# Patient Record
Sex: Male | Born: 1995 | Race: Black or African American | Hispanic: No | Marital: Single | Smoking: Current some day smoker
Health system: Southern US, Community
[De-identification: ages and names within clinical notes are randomized; demographics above are authoritative.]

## PROBLEM LIST (undated history)

## (undated) DIAGNOSIS — F419 Anxiety disorder, unspecified: Secondary | ICD-10-CM

## (undated) DIAGNOSIS — F329 Major depressive disorder, single episode, unspecified: Secondary | ICD-10-CM

## (undated) DIAGNOSIS — F909 Attention-deficit hyperactivity disorder, unspecified type: Secondary | ICD-10-CM

## (undated) DIAGNOSIS — F32A Depression, unspecified: Secondary | ICD-10-CM

## (undated) DIAGNOSIS — R569 Unspecified convulsions: Secondary | ICD-10-CM

---

## 2017-03-17 ENCOUNTER — Encounter (HOSPITAL_COMMUNITY): Payer: Self-pay

## 2017-03-17 ENCOUNTER — Emergency Department (HOSPITAL_COMMUNITY): Payer: Self-pay | Admitting: Anesthesiology

## 2017-03-17 ENCOUNTER — Observation Stay (HOSPITAL_COMMUNITY)
Admission: EM | Admit: 2017-03-17 | Discharge: 2017-03-17 | Disposition: A | Discharge status: Home / Self Care | Payer: Self-pay | Attending: Otolaryngology | Admitting: Otolaryngology

## 2017-03-17 ENCOUNTER — Encounter (HOSPITAL_COMMUNITY)
Admission: EM | Disposition: A | Discharge status: Home / Self Care | Payer: Self-pay | Source: Home / Self Care | Attending: Emergency Medicine

## 2017-03-17 ENCOUNTER — Emergency Department (HOSPITAL_COMMUNITY): Payer: Self-pay

## 2017-03-17 ENCOUNTER — Other Ambulatory Visit: Payer: Self-pay

## 2017-03-17 DIAGNOSIS — S01511A Laceration without foreign body of lip, initial encounter: Secondary | ICD-10-CM | POA: Insufficient documentation

## 2017-03-17 DIAGNOSIS — F17200 Nicotine dependence, unspecified, uncomplicated: Secondary | ICD-10-CM | POA: Insufficient documentation

## 2017-03-17 DIAGNOSIS — S02641A Fracture of ramus of right mandible, initial encounter for closed fracture: Secondary | ICD-10-CM | POA: Insufficient documentation

## 2017-03-17 DIAGNOSIS — S02651A Fracture of angle of right mandible, initial encounter for closed fracture: Principal | ICD-10-CM | POA: Insufficient documentation

## 2017-03-17 DIAGNOSIS — S0181XA Laceration without foreign body of other part of head, initial encounter: Secondary | ICD-10-CM | POA: Insufficient documentation

## 2017-03-17 DIAGNOSIS — X58XXXA Exposure to other specified factors, initial encounter: Secondary | ICD-10-CM | POA: Insufficient documentation

## 2017-03-17 HISTORY — PX: MANDIBLE FRACTURE SURGERY: SHX706

## 2017-03-17 HISTORY — DX: Attention-deficit hyperactivity disorder, unspecified type: F90.9

## 2017-03-17 HISTORY — PX: ORIF MANDIBULAR FRACTURE: SHX2127

## 2017-03-17 HISTORY — DX: Depression, unspecified: F32.A

## 2017-03-17 HISTORY — DX: Major depressive disorder, single episode, unspecified: F32.9

## 2017-03-17 HISTORY — DX: Anxiety disorder, unspecified: F41.9

## 2017-03-17 LAB — CBC
HCT: 43.4 % (ref 39.0–52.0)
Hemoglobin: 14.8 g/dL (ref 13.0–17.0)
MCH: 31.3 pg (ref 26.0–34.0)
MCHC: 34.1 g/dL (ref 30.0–36.0)
MCV: 91.8 fL (ref 78.0–100.0)
PLATELETS: 247 10*3/uL (ref 150–400)
RBC: 4.73 MIL/uL (ref 4.22–5.81)
RDW: 14 % (ref 11.5–15.5)
WBC: 10.8 10*3/uL — AB (ref 4.0–10.5)

## 2017-03-17 LAB — URINALYSIS, ROUTINE W REFLEX MICROSCOPIC
BILIRUBIN URINE: NEGATIVE
Bacteria, UA: NONE SEEN
Glucose, UA: NEGATIVE mg/dL
Hgb urine dipstick: NEGATIVE
Ketones, ur: NEGATIVE mg/dL
Leukocytes, UA: NEGATIVE
Nitrite: NEGATIVE
PH: 8 (ref 5.0–8.0)
Protein, ur: 30 mg/dL — AB
SPECIFIC GRAVITY, URINE: 1.011 (ref 1.005–1.030)
SQUAMOUS EPITHELIAL / LPF: NONE SEEN

## 2017-03-17 LAB — BASIC METABOLIC PANEL
Anion gap: 11 (ref 5–15)
CALCIUM: 9.4 mg/dL (ref 8.9–10.3)
CO2: 26 mmol/L (ref 22–32)
CREATININE: 0.88 mg/dL (ref 0.61–1.24)
Chloride: 104 mmol/L (ref 101–111)
GFR calc non Af Amer: >60 mL/min (ref >60)
Glucose, Bld: 95 mg/dL (ref 65–99)
Potassium: 3.2 mmol/L — ABNORMAL LOW (ref 3.5–5.1)
SODIUM: 141 mmol/L (ref 135–145)

## 2017-03-17 LAB — ETHANOL: ALCOHOL ETHYL (B): 88 mg/dL — AB (ref <10)

## 2017-03-17 SURGERY — OPEN REDUCTION INTERNAL FIXATION (ORIF) MANDIBULAR FRACTURE
Anesthesia: General | Site: Mouth

## 2017-03-17 MED ORDER — LIDOCAINE HCL (PF) 1 % IJ SOLN
5.0000 mL | Freq: Once | INTRAMUSCULAR | Status: AC
  Administered 2017-03-17: 5 mL via INTRADERMAL
  Filled 2017-03-17: qty 5

## 2017-03-17 MED ORDER — OXYMETAZOLINE HCL 0.05 % NA SOLN
NASAL | Status: DC | PRN
  Administered 2017-03-17: 2 via NASAL

## 2017-03-17 MED ORDER — FENTANYL CITRATE (PF) 250 MCG/5ML IJ SOLN
INTRAMUSCULAR | Status: AC
  Filled 2017-03-17: qty 5

## 2017-03-17 MED ORDER — DEXTROSE-NACL 5-0.45 % IV SOLN
INTRAVENOUS | Status: DC
  Administered 2017-03-17: 10:00:00 via INTRAVENOUS

## 2017-03-17 MED ORDER — LIDOCAINE HCL (CARDIAC) 20 MG/ML IV SOLN
INTRAVENOUS | Status: DC | PRN
  Administered 2017-03-17: 100 mg via INTRATRACHEAL

## 2017-03-17 MED ORDER — CEPHALEXIN 500 MG PO CAPS: 500.0000 mg | ORAL_CAPSULE | Freq: Three times a day (TID) | ORAL | 0 refills | Status: DC

## 2017-03-17 MED ORDER — DIPHENHYDRAMINE HCL 50 MG/ML IJ SOLN
INTRAMUSCULAR | Status: DC | PRN
  Administered 2017-03-17: 25 mg via INTRAVENOUS

## 2017-03-17 MED ORDER — OXYCODONE HCL 5 MG/5ML PO SOLN
5.0000 mg | ORAL | Status: DC | PRN
  Administered 2017-03-17 (×2): 5 mg via ORAL
  Filled 2017-03-17 (×2): qty 5

## 2017-03-17 MED ORDER — DEXAMETHASONE SODIUM PHOSPHATE 10 MG/ML IJ SOLN
INTRAMUSCULAR | Status: DC | PRN
  Administered 2017-03-17: 10 mg via INTRAVENOUS

## 2017-03-17 MED ORDER — PROMETHAZINE HCL 25 MG/ML IJ SOLN: 6.2500 mg | INTRAMUSCULAR | Status: DC | PRN

## 2017-03-17 MED ORDER — HYDROCODONE-ACETAMINOPHEN 5-325 MG PO TABS: 1.0000 | ORAL_TABLET | Freq: Four times a day (QID) | ORAL | 0 refills | Status: DC | PRN

## 2017-03-17 MED ORDER — CEFAZOLIN SODIUM-DEXTROSE 2-3 GM-%(50ML) IV SOLR
INTRAVENOUS | Status: DC | PRN
  Administered 2017-03-17: 2 g via INTRAVENOUS

## 2017-03-17 MED ORDER — HYDROCODONE-ACETAMINOPHEN 7.5-325 MG/15ML PO SOLN: 10.0000 mL | ORAL | 0 refills | Status: DC | PRN

## 2017-03-17 MED ORDER — MORPHINE SULFATE (PF) 4 MG/ML IV SOLN: 2.0000 mg | INTRAVENOUS | Status: DC | PRN

## 2017-03-17 MED ORDER — ONDANSETRON HCL 4 MG/2ML IJ SOLN
INTRAMUSCULAR | Status: DC | PRN
  Administered 2017-03-17: 4 mg via INTRAVENOUS

## 2017-03-17 MED ORDER — SUCCINYLCHOLINE CHLORIDE 20 MG/ML IJ SOLN
INTRAMUSCULAR | Status: DC | PRN
  Administered 2017-03-17: 100 mg via INTRAVENOUS

## 2017-03-17 MED ORDER — LIDOCAINE HCL (PF) 1 % IJ SOLN
INTRAMUSCULAR | Status: AC
  Filled 2017-03-17: qty 30

## 2017-03-17 MED ORDER — PROPOFOL 10 MG/ML IV BOLUS
INTRAVENOUS | Status: AC
  Filled 2017-03-17: qty 40

## 2017-03-17 MED ORDER — LACTATED RINGERS IV SOLN
INTRAVENOUS | Status: DC | PRN
  Administered 2017-03-17 (×2): via INTRAVENOUS

## 2017-03-17 MED ORDER — 0.9 % SODIUM CHLORIDE (POUR BTL) OPTIME
TOPICAL | Status: DC | PRN
  Administered 2017-03-17: 1000 mL

## 2017-03-17 MED ORDER — ONDANSETRON HCL 4 MG/2ML IJ SOLN: 4.0000 mg | Freq: Four times a day (QID) | INTRAMUSCULAR | Status: DC | PRN

## 2017-03-17 MED ORDER — CLINDAMYCIN HCL 300 MG PO CAPS: 300.0000 mg | ORAL_CAPSULE | Freq: Three times a day (TID) | ORAL | 0 refills | Status: DC

## 2017-03-17 MED ORDER — CEFAZOLIN SODIUM-DEXTROSE 2-4 GM/100ML-% IV SOLN
2.0000 g | Freq: Three times a day (TID) | INTRAVENOUS | Status: DC
  Administered 2017-03-17: 2 g via INTRAVENOUS
  Filled 2017-03-17 (×3): qty 100

## 2017-03-17 MED ORDER — ONDANSETRON 4 MG PO TBDP: 4.0000 mg | ORAL_TABLET | Freq: Four times a day (QID) | ORAL | Status: DC | PRN

## 2017-03-17 MED ORDER — FENTANYL CITRATE (PF) 250 MCG/5ML IJ SOLN
INTRAMUSCULAR | Status: DC | PRN
  Administered 2017-03-17: 50 ug via INTRAVENOUS
  Administered 2017-03-17: 100 ug via INTRAVENOUS

## 2017-03-17 MED ORDER — FENTANYL CITRATE (PF) 100 MCG/2ML IJ SOLN: 25.0000 ug | INTRAMUSCULAR | Status: DC | PRN

## 2017-03-17 MED ORDER — PROPOFOL 10 MG/ML IV BOLUS
INTRAVENOUS | Status: DC | PRN
  Administered 2017-03-17: 200 mg via INTRAVENOUS

## 2017-03-17 MED ORDER — OXYMETAZOLINE HCL 0.05 % NA SOLN
NASAL | Status: AC
  Filled 2017-03-17: qty 15

## 2017-03-17 SURGICAL SUPPLY — 38 items
BLADE SURG 10 STRL SS (BLADE) ×3 IMPLANT
BLADE SURG 15 STRL LF DISP TIS (BLADE) IMPLANT
BLADE SURG 15 STRL SS (BLADE)
CANISTER SUCT 3000ML PPV (MISCELLANEOUS) ×3 IMPLANT
CLEANER TIP ELECTROSURG 2X2 (MISCELLANEOUS) ×3 IMPLANT
CONFORMERS SILICONE 5649 (OPHTHALMIC RELATED) IMPLANT
COVER SURGICAL LIGHT HANDLE (MISCELLANEOUS) ×6 IMPLANT
CRADLE DONUT ADULT HEAD (MISCELLANEOUS) IMPLANT
DECANTER SPIKE VIAL GLASS SM (MISCELLANEOUS) ×3 IMPLANT
DRAPE HALF SHEET 40X57 (DRAPES) IMPLANT
ELECT COATED BLADE 2.86 ST (ELECTRODE) ×3 IMPLANT
ELECT NEEDLE BLADE 2-5/6 (NEEDLE) IMPLANT
ELECT REM PT RETURN 9FT ADLT (ELECTROSURGICAL) ×3
ELECTRODE REM PT RTRN 9FT ADLT (ELECTROSURGICAL) ×1 IMPLANT
GLOVE ECLIPSE 7.5 STRL STRAW (GLOVE) ×3 IMPLANT
GOWN STRL REUS W/ TWL LRG LVL3 (GOWN DISPOSABLE) ×2 IMPLANT
GOWN STRL REUS W/TWL LRG LVL3 (GOWN DISPOSABLE) ×4
KIT BASIN OR (CUSTOM PROCEDURE TRAY) ×3 IMPLANT
KIT ROOM TURNOVER OR (KITS) ×3 IMPLANT
NEEDLE HYPO 25GX1X1/2 BEV (NEEDLE) IMPLANT
NS IRRIG 1000ML POUR BTL (IV SOLUTION) ×3 IMPLANT
PAD ARMBOARD 7.5X6 YLW CONV (MISCELLANEOUS) ×6 IMPLANT
PATTIES SURGICAL .5 X3 (DISPOSABLE) IMPLANT
PENCIL FOOT CONTROL (ELECTRODE) ×3 IMPLANT
SCREW UPPER FACE 2.0X12MM (Screw) ×12 IMPLANT
SUCTION FRAZIER HANDLE 10FR (MISCELLANEOUS) ×2
SUCTION TUBE FRAZIER 10FR DISP (MISCELLANEOUS) ×1 IMPLANT
SUT CHROMIC 3 0 PS 2 (SUTURE) ×3 IMPLANT
SUT CHROMIC 4 0 PS 2 18 (SUTURE) ×3 IMPLANT
SUT ETHILON 5 0 P 3 18 (SUTURE) ×2
SUT NYLON ETHILON 5-0 P-3 1X18 (SUTURE) ×1 IMPLANT
SUT SILK 2 0 PERMA HAND 18 BK (SUTURE) IMPLANT
SUT STEEL 0 (SUTURE)
SUT STEEL 0 18XMFL TIE 17 (SUTURE) IMPLANT
SUT STEEL 4 (SUTURE) ×3 IMPLANT
TOWEL OR 17X24 6PK STRL BLUE (TOWEL DISPOSABLE) ×3 IMPLANT
TRAY ENT MC OR (CUSTOM PROCEDURE TRAY) ×3 IMPLANT
WATER STERILE IRR 1000ML POUR (IV SOLUTION) ×3 IMPLANT
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
## 2017-03-17 NOTE — Transfer of Care (Signed)
Immediate Anesthesia Transfer of Care Note  Patient: Brett Underwood  Procedure(s) Performed: MAXILLA MANDIBULAR FRACTURE FIXATION (N/A Mouth)  Patient Location: PACU  Anesthesia Type:General  Level of Consciousness: awake, alert  and oriented  Airway & Oxygen Therapy: Patient Spontanous Breathing and Patient connected to nasal cannula oxygen  Post-op Assessment: Report given to RN, Post -op Vital signs reviewed and stable and Patient moving all extremities X 4  Post vital signs: Reviewed and stable  Last Vitals:  Vitals:   03/17/17 0500 03/17/17 0515  BP: 129/79 117/67  Pulse: 96 95  Resp: 14   Temp:    SpO2: 96% 95%    Last Pain:  Vitals:   03/17/17 0340  TempSrc:   PainSc: 10-Worst pain ever         Complications: No apparent anesthesia complications
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
## 2017-03-17 NOTE — Discharge Summary (Signed)
Physician Discharge Summary  Patient ID: Brett Underwood MRN: [id number] DOB/AGE: 1995/07/26 22 y.o.  Admit date: 03/17/2017 Discharge date: 03/17/2017  Admission Diagnoses:  Active Problems:   Fracture of angle of right mandible, initial encounter for closed fracture [facility])   Discharge Diagnoses:  Same  Surgeries: Procedure(s): MAXILLA MANDIBULAR FRACTURE FIXATION on 03/17/2017   Consultants: None  Discharged Condition: Improved  Hospital Course: Brett Underwood is an 22 y.o. male who was admitted 03/17/2017 with a diagnosis of Active Problems:   Fracture of angle of right mandible, initial encounter for closed fracture (HCC)  and went to the operating room on 03/17/2017 and underwent the above named procedures.   Pt stable d/c pm 3/1. F/u in ~2 wks.  Recent vital signs:  Vitals:   03/17/17 0754 03/17/17 1419  BP: (!) 145/100 (!) 163/90  Pulse: 82 94  Resp: 19   Temp: 99.3 F (37.4 C) 98 F (36.7 C)  SpO2: 97% 100%    Recent laboratory studies:  Results for orders placed or performed during the hospital encounter of 03/17/17  Basic metabolic panel  Result Value Ref Range   Sodium 141 135 - 145 mmol/L   Potassium 3.2 (L) 3.5 - 5.1 mmol/L   Chloride 104 101 - 111 mmol/L   CO2 26 22 - 32 mmol/L   Glucose, Bld 95 65 - 99 mg/dL   BUN <5 (L) 6 - 20 mg/dL   Creatinine, Ser 9.62 0.61 - 1.24 mg/dL   Calcium 9.4 8.9 - 95.2 mg/dL   GFR calc non Af Amer >60 >60 mL/min   GFR calc Af Amer >60 >60 mL/min   Anion gap 11 5 - 15  CBC  Result Value Ref Range   WBC 10.8 (H) 4.0 - 10.5 K/uL   RBC 4.73 4.22 - 5.81 MIL/uL   Hemoglobin 14.8 13.0 - 17.0 g/dL   HCT 84.1 32.4 - 40.1 %   MCV 91.8 78.0 - 100.0 fL   MCH 31.3 26.0 - 34.0 pg   MCHC 34.1 30.0 - 36.0 g/dL   RDW 02.7 25.3 - 66.4 %   Platelets 247 150 - 400 K/uL  Urinalysis, Routine w reflex microscopic  Result Value Ref Range   Color, Urine YELLOW YELLOW   APPearance CLEAR CLEAR   Specific Gravity, Urine 1.011 1.005 - 1.030   pH  8.0 5.0 - 8.0   Glucose, UA NEGATIVE NEGATIVE mg/dL   Hgb urine dipstick NEGATIVE NEGATIVE   Bilirubin Urine NEGATIVE NEGATIVE   Ketones, ur NEGATIVE NEGATIVE mg/dL   Protein, ur 30 (A) NEGATIVE mg/dL   Nitrite NEGATIVE NEGATIVE   Leukocytes, UA NEGATIVE NEGATIVE   RBC / HPF 0-5 0 - 5 RBC/hpf   WBC, UA 6-30 0 - 5 WBC/hpf   Bacteria, UA NONE SEEN NONE SEEN   Squamous Epithelial / LPF NONE SEEN NONE SEEN   Mucus PRESENT    Hyaline Casts, UA PRESENT   Ethanol  Result Value Ref Range   Alcohol, Ethyl (B) 88 (H) <10 mg/dL    Discharge Medications:   Allergies as of 03/17/2017   No Known Allergies     Medication List    TAKE these medications   cephALEXin 500 MG capsule Commonly known as:  KEFLEX Take 1 capsule (500 mg total) by mouth 3 (three) times daily.   clindamycin 300 MG capsule Commonly known as:  CLEOCIN Take 1 capsule (300 mg total) by mouth 3 (three) times daily. May open cap and take with liquid  HYDROcodone-acetaminophen 5-325 MG tablet Commonly known as:  LORTAB Take 1 tablet by mouth every 6 (six) hours as needed for moderate pain.   HYDROcodone-acetaminophen 7.5-325 mg/15 ml solution Commonly known as:  HYCET Take 10-15 mLs by mouth every 4 (four) hours as needed for moderate pain.       Diagnostic Studies: Ct Head Wo Contrast  Result Date: 03/17/2017 CLINICAL DATA:  22 year old male with trauma. EXAM: CT HEAD WITHOUT CONTRAST CT MAXILLOFACIAL WITHOUT CONTRAST CT CERVICAL SPINE WITHOUT CONTRAST TECHNIQUE: Multidetector CT imaging of the head, cervical spine, and maxillofacial structures were performed using the standard protocol without intravenous contrast. Multiplanar CT image reconstructions of the cervical spine and maxillofacial structures were also generated. COMPARISON:  None. FINDINGS: CT HEAD FINDINGS Brain: No evidence of acute infarction, hemorrhage, hydrocephalus, extra-axial collection or mass lesion/mass effect. Vascular: No hyperdense vessel or  unexpected calcification. Skull: Normal. Negative for fracture or focal lesion. Other: None CT MAXILLOFACIAL FINDINGS Osseous: There is a nondisplaced fracture of the right mandibular ramus. No other acute fracture identified. There is thin lucency surrounding the root of the left maxillary central incisor tooth which may be related to prior dental work or represent loosening. Clinical correlation is recommended. Orbits: Negative. No traumatic or inflammatory finding. Sinuses: Clear. Soft tissues: There is linear high attenuating material in the inferior lip likely related to laceration and small foreign object. Clinical correlation is recommended. CT CERVICAL SPINE FINDINGS Alignment: Normal. Skull base and vertebrae: No acute fracture. No primary bone lesion or focal pathologic process. Soft tissues and spinal canal: No prevertebral fluid or swelling. No visible canal hematoma. Disc levels:  No acute findings.  No degenerative changes Upper chest: Negative. Other: None IMPRESSION: 1. Normal noncontrast CT of the brain. 2. No acute/traumatic cervical spine pathology. 3. Nondisplaced fracture of the right mandibular ramus. 4. Laceration of the lower lip with probable retained foreign objects. Clinical correlation is recommended. Electronically Signed   By: Elgie Collard M.D.   On: 03/17/2017 02:30   Ct Cervical Spine Wo Contrast  Result Date: 03/17/2017 CLINICAL DATA:  22 year old male with trauma. EXAM: CT HEAD WITHOUT CONTRAST CT MAXILLOFACIAL WITHOUT CONTRAST CT CERVICAL SPINE WITHOUT CONTRAST TECHNIQUE: Multidetector CT imaging of the head, cervical spine, and maxillofacial structures were performed using the standard protocol without intravenous contrast. Multiplanar CT image reconstructions of the cervical spine and maxillofacial structures were also generated. COMPARISON:  None. FINDINGS: CT HEAD FINDINGS Brain: No evidence of acute infarction, hemorrhage, hydrocephalus, extra-axial collection or mass  lesion/mass effect. Vascular: No hyperdense vessel or unexpected calcification. Skull: Normal. Negative for fracture or focal lesion. Other: None CT MAXILLOFACIAL FINDINGS Osseous: There is a nondisplaced fracture of the right mandibular ramus. No other acute fracture identified. There is thin lucency surrounding the root of the left maxillary central incisor tooth which may be related to prior dental work or represent loosening. Clinical correlation is recommended. Orbits: Negative. No traumatic or inflammatory finding. Sinuses: Clear. Soft tissues: There is linear high attenuating material in the inferior lip likely related to laceration and small foreign object. Clinical correlation is recommended. CT CERVICAL SPINE FINDINGS Alignment: Normal. Skull base and vertebrae: No acute fracture. No primary bone lesion or focal pathologic process. Soft tissues and spinal canal: No prevertebral fluid or swelling. No visible canal hematoma. Disc levels:  No acute findings.  No degenerative changes Upper chest: Negative. Other: None IMPRESSION: 1. Normal noncontrast CT of the brain. 2. No acute/traumatic cervical spine pathology. 3. Nondisplaced fracture of the right mandibular  ramus. 4. Laceration of the lower lip with probable retained foreign objects. Clinical correlation is recommended. Electronically Signed   By: Elgie Collard M.D.   On: 03/17/2017 02:30   Ct Maxillofacial Wo Contrast  Result Date: 03/17/2017 CLINICAL DATA:  22 year old male with trauma. EXAM: CT HEAD WITHOUT CONTRAST CT MAXILLOFACIAL WITHOUT CONTRAST CT CERVICAL SPINE WITHOUT CONTRAST TECHNIQUE: Multidetector CT imaging of the head, cervical spine, and maxillofacial structures were performed using the standard protocol without intravenous contrast. Multiplanar CT image reconstructions of the cervical spine and maxillofacial structures were also generated. COMPARISON:  None. FINDINGS: CT HEAD FINDINGS Brain: No evidence of acute infarction,  hemorrhage, hydrocephalus, extra-axial collection or mass lesion/mass effect. Vascular: No hyperdense vessel or unexpected calcification. Skull: Normal. Negative for fracture or focal lesion. Other: None CT MAXILLOFACIAL FINDINGS Osseous: There is a nondisplaced fracture of the right mandibular ramus. No other acute fracture identified. There is thin lucency surrounding the root of the left maxillary central incisor tooth which may be related to prior dental work or represent loosening. Clinical correlation is recommended. Orbits: Negative. No traumatic or inflammatory finding. Sinuses: Clear. Soft tissues: There is linear high attenuating material in the inferior lip likely related to laceration and small foreign object. Clinical correlation is recommended. CT CERVICAL SPINE FINDINGS Alignment: Normal. Skull base and vertebrae: No acute fracture. No primary bone lesion or focal pathologic process. Soft tissues and spinal canal: No prevertebral fluid or swelling. No visible canal hematoma. Disc levels:  No acute findings.  No degenerative changes Upper chest: Negative. Other: None IMPRESSION: 1. Normal noncontrast CT of the brain. 2. No acute/traumatic cervical spine pathology. 3. Nondisplaced fracture of the right mandibular ramus. 4. Laceration of the lower lip with probable retained foreign objects. Clinical correlation is recommended. Electronically Signed   By: Elgie Collard M.D.   On: 03/17/2017 02:30    Disposition: Final discharge disposition Brett confirmed  Discharge Instructions    Call MD for:  difficulty breathing, headache or visual disturbances   Complete by:  As directed    Call MD for:  extreme fatigue   Complete by:  As directed    Call MD for:  hives   Complete by:  As directed    Call MD for:  persistant dizziness or light-headedness   Complete by:  As directed    Call MD for:  persistant nausea and vomiting   Complete by:  As directed    Call MD for:  redness, tenderness, or  signs of infection (pain, swelling, redness, odor or green/yellow discharge around incision site)   Complete by:  As directed    Call MD for:  severe uncontrolled pain   Complete by:  As directed    Call MD for:  temperature >100.4   Complete by:  As directed    Diet - low sodium heart healthy   Complete by:  As directed    Diet - low sodium heart healthy   Complete by:  As directed    Discharge instructions   Complete by:  As directed    You MUST have wire cutters with you at all times. Follow up in 1 week to office and call for appointment at [phone]. Call if the wires are loose. You need to see a dentist immediately about the front tooth which is very loose. Call if any questions   Discharge instructions   Complete by:  As directed    Jaw Fracture Instructions:  1. Limited activity 2. Liquid diet only  3. May bathe and shower 4. Saline nasal spray - 4 puffs/nostril 4 times daily 5. Elevate Head of Bed 6. Ice compress to jaw 7. Rinse mouth with water twice daily 8. Motrin Elixir three times daily for 10 days 9. Keep wire cutters on hand at ALL TIMES, only cut wires if vomiting  Contact our office immediately if wires are cut   Fractured Jaw Diet This diet should be used after jaw or mouth surgery, wired jaw surgery, or dental surgery. The consistency of foods in this diet should be thin enough to be sipped from a straw or given through a syringe. It is important to consume enough calories and protein to prevent weight loss and to promote healing after surgery. You will need to have 3 meals and 3 snacks daily. It is important to eat from a variety of food groups. Foods you normally eat may be blended to the correct texture. INSTRUCTIONS FOR BLENDING FOODS Prepare foods by removing skins, seeds, and peels.  Cook meats and vegetables thoroughly. Avoid using raw eggs. Powdered or pasteurized egg mixtures may be used.  Cut foods into small pieces and mix with a small amount of  liquid in a food processor or blender. Continue to add liquids until foods become thin enough to sip through a straw.  Add juice, milk, cream, broth, gravy, or vegetable juice to add flavor and to thin foods.  Blending foods sometimes causes air bubbles that may Brett be desirable. Heating foods after blending will reduce the foam produced from blending.  TIPS If you are losing weight, you may need to add extra calories to your food by:  Adding powdered milk or protein powder to food.  Adding extra fats, such as tub margarine, sour cream, cream cheese, cream, nut butters (such as peanut butter or almond butter), and half-and-half.  Adding sweets, such as honey, ice cream, black strap molasses, or sugar.  Your teeth and mouth may be sensitive to extreme temperatures. Heat or cool your foods only to lukewarm or cool temperatures.  Stock Water quality scientist with a variety of liquid nutritional supplement drinks.  Take a liquid multivitamin to ensure you are getting adequate nutrients.  Use baby food if you are short on time or energy.  FOOD CHOICES ALL FOODS MUST BE BLENDED. Starches 4 servings or more Hot cereals, such as oatmeal, grits, ground wheat cereals, and polenta.  Mashed potatoes.  Vegetables 3 servings or more All vegetables and vegetable juices.  Fruit 2 servings or more All fruits and fruit juices.  Meat and Meat Substitutes 2 servings or more (6 oz per day) Soft-boiled eggs, scrambled eggs, cottage cheese, cheese sauce.  Ground meats, such as hamburger, [place], sausage, meatloaf.  Custard, baby food meat.  Milk 2 cups or equivalent Liquid, powdered, or evaporated milk.  Buttermilk or chocolate milk.  Drinkable yogurt.  Sherbert, ice cream, milkshakes, eggnog, pudding, and liquid supplements.  Beverages Coffee (regular or decaffeinated), tea, and mineral water.  Liquid supplements.  Condiments All seasonings and condiments that blend well.   Document Released: 06/23/2009  Document Revised: 09/15/2010 Document Reviewed: 06/23/2009 [facility] Patient Information 2012 [place], [place].   Increase activity slowly   Complete by:  As directed    Increase activity slowly   Complete by:  As directed       Follow-up Information    Suzanna Obey, MD. Schedule an appointment as soon as possible for a visit in 2 week(s).   Specialty:  Otolaryngology Contact information: [phone] [address] [place] [address] [phone]            Signed: Osborn CohoSHOEMAKER, Sharolyn Weber 03/17/2017, 5:43 PM
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
## 2017-03-17 NOTE — ED Notes (Signed)
Attempted to contact patient's family members. No response at this time.

## 2017-03-17 NOTE — Progress Notes (Signed)
ENT Progress Note: POD #1 s/p Procedure(s): MAXILLA MANDIBULAR FRACTURE FIXATION   Subjective: Awake and alert, mild discomfort  Objective: Vital signs in last 24 hours: Temp:  [98 F (36.7 C)-99.3 F (37.4 C)] 98 F (36.7 C) (03/01 1419) Pulse Rate:  [82-103] 94 (03/01 1419) Resp:  [10-26] 19 (03/01 0754) BP: (114-163)/(61-107) 163/90 (03/01 1419) SpO2:  [95 %-100 %] 100 % (03/01 1419) Weight:  [74 kg (163 lb 2.3 oz)] 74 kg (163 lb 2.3 oz) (03/01 1419) Weight change:     Intake/Output from previous day: 02/28 0701 - 03/01 0700 In: 1100 [I.V.:1100] Out: 10 [Blood:10] Intake/Output this shift: Total I/O In: 2510 [P.O.:1560; I.V.:850; IV Piggyback:100] Out: 3000 [Urine:3000]  Labs: Recent Labs    03/17/17 0219  WBC 10.8*  HGB 14.8  HCT 43.4  PLT 247   Recent Labs    03/17/17 0219  NA 141  K 3.2*  CL 104  CO2 26  GLUCOSE 95  BUN <5*  CALCIUM 9.4    Studies/Results: Ct Head Wo Contrast  Result Date: 03/17/2017 CLINICAL DATA:  22 year old male with trauma. EXAM: CT HEAD WITHOUT CONTRAST CT MAXILLOFACIAL WITHOUT CONTRAST CT CERVICAL SPINE WITHOUT CONTRAST TECHNIQUE: Multidetector CT imaging of the head, cervical spine, and maxillofacial structures were performed using the standard protocol without intravenous contrast. Multiplanar CT image reconstructions of the cervical spine and maxillofacial structures were also generated. COMPARISON:  None. FINDINGS: CT HEAD FINDINGS Brain: No evidence of acute infarction, hemorrhage, hydrocephalus, extra-axial collection or mass lesion/mass effect. Vascular: No hyperdense vessel or unexpected calcification. Skull: Normal. Negative for fracture or focal lesion. Other: None CT MAXILLOFACIAL FINDINGS Osseous: There is a nondisplaced fracture of the right mandibular ramus. No other acute fracture identified. There is thin lucency surrounding the root of the left maxillary central incisor tooth which may be related to prior dental  work or represent loosening. Clinical correlation is recommended. Orbits: Negative. No traumatic or inflammatory finding. Sinuses: Clear. Soft tissues: There is linear high attenuating material in the inferior lip likely related to laceration and small foreign object. Clinical correlation is recommended. CT CERVICAL SPINE FINDINGS Alignment: Normal. Skull base and vertebrae: No acute fracture. No primary bone lesion or focal pathologic process. Soft tissues and spinal canal: No prevertebral fluid or swelling. No visible canal hematoma. Disc levels:  No acute findings.  No degenerative changes Upper chest: Negative. Other: None IMPRESSION: 1. Normal noncontrast CT of the brain. 2. No acute/traumatic cervical spine pathology. 3. Nondisplaced fracture of the right mandibular ramus. 4. Laceration of the lower lip with probable retained foreign objects. Clinical correlation is recommended. Electronically Signed   By: Elgie CollardArash  Radparvar M.D.   On: 03/17/2017 02:30   Ct Cervical Spine Wo Contrast  Result Date: 03/17/2017 CLINICAL DATA:  22 year old male with trauma. EXAM: CT HEAD WITHOUT CONTRAST CT MAXILLOFACIAL WITHOUT CONTRAST CT CERVICAL SPINE WITHOUT CONTRAST TECHNIQUE: Multidetector CT imaging of the head, cervical spine, and maxillofacial structures were performed using the standard protocol without intravenous contrast. Multiplanar CT image reconstructions of the cervical spine and maxillofacial structures were also generated. COMPARISON:  None. FINDINGS: CT HEAD FINDINGS Brain: No evidence of acute infarction, hemorrhage, hydrocephalus, extra-axial collection or mass lesion/mass effect. Vascular: No hyperdense vessel or unexpected calcification. Skull: Normal. Negative for fracture or focal lesion. Other: None CT MAXILLOFACIAL FINDINGS Osseous: There is a nondisplaced fracture of the right mandibular ramus. No other acute fracture identified. There is thin lucency surrounding the root of the left maxillary  central incisor tooth  which may be related to prior dental work or represent loosening. Clinical correlation is recommended. Orbits: Negative. No traumatic or inflammatory finding. Sinuses: Clear. Soft tissues: There is linear high attenuating material in the inferior lip likely related to laceration and small foreign object. Clinical correlation is recommended. CT CERVICAL SPINE FINDINGS Alignment: Normal. Skull base and vertebrae: No acute fracture. No primary bone lesion or focal pathologic process. Soft tissues and spinal canal: No prevertebral fluid or swelling. No visible canal hematoma. Disc levels:  No acute findings.  No degenerative changes Upper chest: Negative. Other: None IMPRESSION: 1. Normal noncontrast CT of the brain. 2. No acute/traumatic cervical spine pathology. 3. Nondisplaced fracture of the right mandibular ramus. 4. Laceration of the lower lip with probable retained foreign objects. Clinical correlation is recommended. Electronically Signed   By: Elgie Collard M.D.   On: 03/17/2017 02:30   Ct Maxillofacial Wo Contrast  Result Date: 03/17/2017 CLINICAL DATA:  22 year old male with trauma. EXAM: CT HEAD WITHOUT CONTRAST CT MAXILLOFACIAL WITHOUT CONTRAST CT CERVICAL SPINE WITHOUT CONTRAST TECHNIQUE: Multidetector CT imaging of the head, cervical spine, and maxillofacial structures were performed using the standard protocol without intravenous contrast. Multiplanar CT image reconstructions of the cervical spine and maxillofacial structures were also generated. COMPARISON:  None. FINDINGS: CT HEAD FINDINGS Brain: No evidence of acute infarction, hemorrhage, hydrocephalus, extra-axial collection or mass lesion/mass effect. Vascular: No hyperdense vessel or unexpected calcification. Skull: Normal. Negative for fracture or focal lesion. Other: None CT MAXILLOFACIAL FINDINGS Osseous: There is a nondisplaced fracture of the right mandibular ramus. No other acute fracture identified. There is thin  lucency surrounding the root of the left maxillary central incisor tooth which may be related to prior dental work or represent loosening. Clinical correlation is recommended. Orbits: Negative. No traumatic or inflammatory finding. Sinuses: Clear. Soft tissues: There is linear high attenuating material in the inferior lip likely related to laceration and small foreign object. Clinical correlation is recommended. CT CERVICAL SPINE FINDINGS Alignment: Normal. Skull base and vertebrae: No acute fracture. No primary bone lesion or focal pathologic process. Soft tissues and spinal canal: No prevertebral fluid or swelling. No visible canal hematoma. Disc levels:  No acute findings.  No degenerative changes Upper chest: Negative. Other: None IMPRESSION: 1. Normal noncontrast CT of the brain. 2. No acute/traumatic cervical spine pathology. 3. Nondisplaced fracture of the right mandibular ramus. 4. Laceration of the lower lip with probable retained foreign objects. Clinical correlation is recommended. Electronically Signed   By: Elgie Collard M.D.   On: 03/17/2017 02:30     PHYSICAL EXAM: MMF in place Min swelling   Assessment/Plan: D/c to home Fracture precautions Plan f/u with Dr. Eual Fines, Nahum Sherrer 03/17/2017, 5:37 PM

## 2017-03-17 NOTE — Progress Notes (Signed)
Patient just left room accompanied by CNA via wheelchair to meet his friend at [facility] entrance.  Discharge insturctions previously given by day shift RN, gave the wire cutter, discharged instructions and prescription.

## 2017-03-17 NOTE — ED Notes (Signed)
Patient to CT on tele

## 2017-03-17 NOTE — ED Notes (Addendum)
MD Byers at bedside  

## 2017-03-17 NOTE — Progress Notes (Signed)
Patient is still waiting for his friend to bring him home, [facility]  Patient just requested PRN pain medication.

## 2017-03-17 NOTE — Care Management Note (Signed)
Case Management Note  Patient Details  Name: Brett Underwood MRN: [id number] Date of Birth: 1996-01-12  Subjective/Objective:                    Action/Plan:  Spoke to patient at bedside . He has prescription coverage and is able to get prescriptions filled.  Expected Discharge Date:  03/17/17               Expected Discharge Plan:  Home/Self Care  In-House Referral:     Discharge planning Services     Post Acute Care Choice:    Choice offered to:  Patient  DME Arranged:  N/A DME Agency:  NA  HH Arranged:  NA HH Agency:  NA  Status of Service:  Completed, signed off  If discussed at Long Length of Stay Meetings, dates discussed:    Additional Comments:  Kingsley PlanWile, Aryssa Rosamond Marie, RN 03/17/2017, 10:49 AM

## 2017-03-17 NOTE — Anesthesia Preprocedure Evaluation (Addendum)
Anesthesia Evaluation  Patient identified by MRN, date of birth, ID band Patient awake    Reviewed: Allergy & Precautions, NPO status , Patient's Chart, lab work & pertinent test results  Airway Mallampati: II  TM Distance: >3 FB Neck ROM: Full  Mouth opening: Limited Mouth Opening  Dental  (+) Teeth Intact, Dental Advisory Given, Loose, Chipped,    Pulmonary Current Smoker,    Pulmonary exam normal breath sounds clear to auscultation       Cardiovascular Exercise Tolerance: Good negative cardio ROS Normal cardiovascular exam Rhythm:Regular Rate:Normal     Neuro/Psych negative neurological ROS  negative psych ROS   GI/Hepatic negative GI ROS, (+)     substance abuse  alcohol use and marijuana use,   Endo/Other  negative endocrine ROS  Renal/GU negative Renal ROS     Musculoskeletal negative musculoskeletal ROS (+)   Abdominal   Peds  Hematology negative hematology ROS (+)   Anesthesia Other Findings Day of surgery medications reviewed with the patient.  Reproductive/Obstetrics                             Anesthesia Physical Anesthesia Plan  ASA: II and emergent  Anesthesia Plan: General   Post-op Pain Management:    Induction: Intravenous  PONV Risk Score and Plan: 1 and Midazolam, Dexamethasone and Ondansetron  Airway Management Planned: Nasal ETT  Additional Equipment:   Intra-op Plan:   Post-operative Plan: Extubation in OR  Informed Consent: I have reviewed the patients History and Physical, chart, labs and discussed the procedure including the risks, benefits and alternatives for the proposed anesthesia with the patient or authorized representative who has indicated his/her understanding and acceptance.   Dental advisory given  Plan Discussed with: CRNA, Anesthesiologist and Surgeon  Anesthesia Plan Comments:        Anesthesia Quick Evaluation

## 2017-03-17 NOTE — Anesthesia Procedure Notes (Signed)
Procedure Name: Intubation Date/Time: 03/17/2017 6:26 AM Performed by: Claudina LickMahony, Fidelis Loth D, CRNA Pre-anesthesia Checklist: Patient identified, Emergency Drugs available, Suction available, Patient being monitored and Timeout performed Patient Re-evaluated:Patient Re-evaluated prior to induction Oxygen Delivery Method: Circle system utilized Preoxygenation: Pre-oxygenation with 100% oxygen Induction Type: Rapid sequence and Cricoid Pressure applied Ventilation: Nasal airway inserted- appropriate to patient size Laryngoscope Size: Glidescope Grade View: Grade I Nasal Tubes: Right, Nasal prep performed and Nasal Rae Tube size: 7.5 mm Number of attempts: 1 Airway Equipment and Method: Video-laryngoscopy (Elective Glidescope) Placement Confirmation: ETT inserted through vocal cords under direct vision,  positive ETCO2 and breath sounds checked- equal and bilateral Secured at: 29 cm Tube secured with: Tape Dental Injury: Teeth and Oropharynx as per pre-operative assessment

## 2017-03-17 NOTE — ED Notes (Signed)
ED Provider at bedside. 

## 2017-03-18 LAB — HIV ANTIBODY (ROUTINE TESTING W REFLEX): HIV SCREEN 4TH GENERATION: NONREACTIVE

## 2017-03-18 NOTE — Anesthesia Postprocedure Evaluation (Signed)
Anesthesia Post Note  Patient: Brett Underwood  Procedure(s) Performed: MAXILLA MANDIBULAR FRACTURE FIXATION (N/A Mouth)     Patient location during evaluation: PACU Anesthesia Type: General Level of consciousness: awake and alert Pain management: pain level controlled Vital Signs Assessment: post-procedure vital signs reviewed and stable Respiratory status: spontaneous breathing, nonlabored ventilation and respiratory function stable Cardiovascular status: blood pressure returned to baseline and stable Postop Assessment: no apparent nausea or vomiting Anesthetic complications: no    Last Vitals:  Vitals:   03/17/17 1419 03/17/17 1801  BP: (!) 163/90 (!) 159/99  Pulse: 94 95  Resp:    Temp: 36.7 C 36.7 C  SpO2: 100% 100%    Last Pain:  Vitals:   03/17/17 2215  TempSrc:   PainSc: 2                  Cecile HearingStephen Edward Oaklan Persons

## 2017-03-20 ENCOUNTER — Encounter (HOSPITAL_COMMUNITY): Payer: Self-pay | Admitting: Otolaryngology

## 2017-08-28 ENCOUNTER — Encounter (HOSPITAL_COMMUNITY): Payer: Self-pay | Admitting: *Deleted

## 2017-08-28 ENCOUNTER — Emergency Department (HOSPITAL_COMMUNITY)
Admission: EM | Admit: 2017-08-28 | Discharge: 2017-08-28 | Disposition: A | Discharge status: Home / Self Care | Payer: Self-pay | Attending: Emergency Medicine | Admitting: Emergency Medicine

## 2017-08-28 ENCOUNTER — Other Ambulatory Visit: Payer: Self-pay

## 2017-08-28 ENCOUNTER — Emergency Department (HOSPITAL_COMMUNITY)
Admission: EM | Admit: 2017-08-28 | Discharge: 2017-08-29 | Disposition: A | Discharge status: Home / Self Care | Payer: No Typology Code available for payment source | Attending: Emergency Medicine | Admitting: Emergency Medicine

## 2017-08-28 DIAGNOSIS — R519 Headache, unspecified: Secondary | ICD-10-CM

## 2017-08-28 DIAGNOSIS — R51 Headache: Secondary | ICD-10-CM

## 2017-08-28 DIAGNOSIS — F17210 Nicotine dependence, cigarettes, uncomplicated: Secondary | ICD-10-CM | POA: Insufficient documentation

## 2017-08-28 DIAGNOSIS — R45851 Suicidal ideations: Secondary | ICD-10-CM

## 2017-08-28 DIAGNOSIS — R6884 Jaw pain: Secondary | ICD-10-CM | POA: Insufficient documentation

## 2017-08-28 DIAGNOSIS — F333 Major depressive disorder, recurrent, severe with psychotic symptoms: Secondary | ICD-10-CM | POA: Insufficient documentation

## 2017-08-28 DIAGNOSIS — Z79899 Other long term (current) drug therapy: Secondary | ICD-10-CM | POA: Insufficient documentation

## 2017-08-28 HISTORY — DX: Unspecified convulsions: R56.9

## 2017-08-28 LAB — COMPREHENSIVE METABOLIC PANEL
ALK PHOS: 54 U/L (ref 38–126)
ALT: 37 U/L (ref 0–44)
AST: 27 U/L (ref 15–41)
Albumin: 4.6 g/dL (ref 3.5–5.0)
Anion gap: 10 (ref 5–15)
BILIRUBIN TOTAL: 0.8 mg/dL (ref 0.3–1.2)
BUN: 11 mg/dL (ref 6–20)
CO2: 27 mmol/L (ref 22–32)
Calcium: 9.5 mg/dL (ref 8.9–10.3)
Chloride: 104 mmol/L (ref 98–111)
Creatinine, Ser: 0.89 mg/dL (ref 0.61–1.24)
GFR calc Af Amer: >60 mL/min (ref >60)
Glucose, Bld: 94 mg/dL (ref 70–99)
Potassium: 3.7 mmol/L (ref 3.5–5.1)
Sodium: 141 mmol/L (ref 135–145)
TOTAL PROTEIN: 7.8 g/dL (ref 6.5–8.1)

## 2017-08-28 LAB — CBC
HCT: 42.8 % (ref 39.0–52.0)
Hemoglobin: 14.6 g/dL (ref 13.0–17.0)
MCH: 30.7 pg (ref 26.0–34.0)
MCHC: 34.1 g/dL (ref 30.0–36.0)
MCV: 90.1 fL (ref 78.0–100.0)
PLATELETS: 333 10*3/uL (ref 150–400)
RBC: 4.75 MIL/uL (ref 4.22–5.81)
RDW: 14.2 % (ref 11.5–15.5)
WBC: 11 10*3/uL — ABNORMAL HIGH (ref 4.0–10.5)

## 2017-08-28 LAB — ACETAMINOPHEN LEVEL: Acetaminophen (Tylenol), Serum: <10 ug/mL — ABNORMAL LOW (ref 10–30)

## 2017-08-28 LAB — SALICYLATE LEVEL: Salicylate Lvl: <7 mg/dL (ref 2.8–30.0)

## 2017-08-28 LAB — ETHANOL

## 2017-08-28 MED ORDER — ACETAMINOPHEN 325 MG PO TABS
325.00 | ORAL_TABLET | ORAL | Status: DC
Start: ? — End: 2017-08-28

## 2017-08-28 MED ORDER — NICOTINE POLACRILEX 2 MG MT GUM
2.00 | CHEWING_GUM | OROMUCOSAL | Status: DC
Start: ? — End: 2017-08-28

## 2017-08-28 MED ORDER — DOCUSATE SODIUM 100 MG PO CAPS
100.00 | ORAL_CAPSULE | ORAL | Status: DC
Start: ? — End: 2017-08-28

## 2017-08-28 MED ORDER — BENZOCAINE-MENTHOL 15-3.6 MG MT LOZG
1.00 | LOZENGE | OROMUCOSAL | Status: DC
Start: ? — End: 2017-08-28

## 2017-08-28 MED ORDER — IBUPROFEN 200 MG PO TABS: 600.0000 mg | ORAL_TABLET | Freq: Three times a day (TID) | ORAL | Status: DC | PRN

## 2017-08-28 MED ORDER — SERTRALINE HCL 100 MG PO TABS
100.00 | ORAL_TABLET | ORAL | Status: DC
Start: 2017-08-28 — End: 2017-08-28

## 2017-08-28 MED ORDER — ALUM & MAG HYDROXIDE-SIMETH 200-200-20 MG/5ML PO SUSP: 30.0000 mL | Freq: Four times a day (QID) | ORAL | Status: DC | PRN

## 2017-08-28 MED ORDER — ARIPIPRAZOLE 5 MG PO TABS
5.00 | ORAL_TABLET | ORAL | Status: DC
Start: 2017-08-28 — End: 2017-08-28

## 2017-08-28 MED ORDER — ALUMINUM-MAGNESIUM-SIMETHICONE 200-200-20 MG/5ML PO SUSP
30.00 | ORAL | Status: DC
Start: ? — End: 2017-08-28

## 2017-08-28 MED ORDER — GENERIC EXTERNAL MEDICATION
5.00 | Status: DC
Start: ? — End: 2017-08-28

## 2017-08-28 MED ORDER — NICOTINE 14 MG/24HR TD PT24
1.00 | MEDICATED_PATCH | TRANSDERMAL | Status: DC
Start: ? — End: 2017-08-28

## 2017-08-28 MED ORDER — ONDANSETRON HCL 4 MG PO TABS: 4.0000 mg | ORAL_TABLET | Freq: Three times a day (TID) | ORAL | Status: DC | PRN

## 2017-08-28 MED ORDER — LORAZEPAM 2 MG/ML IJ SOLN
2.00 | INTRAMUSCULAR | Status: DC
Start: ? — End: 2017-08-28

## 2017-08-28 MED ORDER — NICOTINE 14 MG/24HR TD PT24
14.0000 mg | MEDICATED_PATCH | Freq: Once | TRANSDERMAL | Status: DC
  Administered 2017-08-28: 14 mg via TRANSDERMAL
  Filled 2017-08-28: qty 1

## 2017-08-28 MED ORDER — TRAZODONE HCL 50 MG PO TABS
50.00 | ORAL_TABLET | ORAL | Status: DC
Start: ? — End: 2017-08-28

## 2017-08-28 NOTE — ED Triage Notes (Signed)
Pt reports SI tonight, I would "jump out in front of a car, jump off a bridge or get hit by a train" Pt taking meds as prescribed. Denies drug or etoh use. Calm and cooperative in triage.

## 2017-08-28 NOTE — ED Notes (Signed)
Bed: WTR5 Expected date:  Expected time:  Means of arrival:  Comments: 

## 2017-08-28 NOTE — ED Notes (Signed)
Pt A&O x 3, no distress noted, calm & cooperative, watching TV at present, presents with SI, plan to jump in front of train, car or jump off Bridge.  Feeling hopeless, pt reports he attempted the same x 3 days ago.  Monitoring for safety, Q 15 min checks in effect.

## 2017-08-28 NOTE — ED Triage Notes (Signed)
Pt presents with bottom jaw pain.  Pt had jaw surgery in March and reports having pain where the screw are in his jaw.  Pt states he hasn't been to any follow up appointments. Pt states the pain has been getting worse over the past three weeks. Pt a/o x 4 and ambulatory.

## 2017-08-28 NOTE — ED Notes (Signed)
Upon going in to discharge pt. Pt is not found in room. BP cuff removed and laying on chair. Unable to obtain vitals due to pt leaving prior to dc instructions being reviewed. Pt was not visualized leaving ED.
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
## 2017-08-28 NOTE — Discharge Instructions (Signed)
You can take Tylenol or Ibuprofen as directed for pain. You can alternate Tylenol and Ibuprofen every 4 hours. If you take Tylenol at 1pm, then you can take Ibuprofen at 5pm. Then you can take Tylenol again at 9pm.   Call Dr. Jearld FentonByers office and arrange for an appointment to get the screws removed.  Return to emergency department for any fever, worsening pain or any other worsening or concerning symptoms.

## 2017-08-28 NOTE — ED Notes (Signed)
Pt belongings have been removed ( 2 belonging bags), changed into scrubs, and wanded by security.

## 2017-08-28 NOTE — ED Provider Notes (Signed)
[facility]   CSN: [id number] Arrival date & time: 08/28/17  2006     History   Chief Complaint Chief Complaint  Patient presents with  . Suicidal    HPI Brett Underwood is a 22 y.o. male with history of seizures, ADHD, anxiety, absence induced psychotic disorder, and depression who presents to the emergency department by [employer] with a chief complaint of suicidal ideation.  He was also recently IVC and admitted for inpatient treatment at [facility] from 8/8-11/19.  The patient states that he was brought in by police after he was found to be walking out into traffic.  He endorses suicidal ideation and states " that is why he was working on traffic.  I would jump out in front of a car, off a bridge, or get hit by a train.  Things are just really bad right now in the keep getting worse."  He will not elaborate on what areas of his life or recent stressors of changed recently.  He denies HI.  He states that he has been hearing voices that he describes as " out of this world and from another planet" that have been in his head all day telling him to kill himself.  No visual hallucinations.  He reports a history of similar voices, but reports that this is the first time in a long time they have been this bad.  Other recent behavioral health admissions include [facility] on 7/21-26, and [facility] in 6/19.    He is a current, 0.5 [employer] cigarette smoker.  Denies alcohol use today.  He also endorses marijuana use.  Denies other IV recreational drug use.  The history is provided by the patient. No language interpreter was used.    Past Medical History:  Diagnosis Date  . ADHD   . Anxiety   . Depression   . Seizures [facility])     Patient Active Problem List   Diagnosis Date Noted  . Fracture of angle of right mandible, initial encounter for closed fracture (HCC) 03/17/2017    Past Surgical History:  Procedure Laterality Date  . MANDIBLE  FRACTURE SURGERY  03/17/2017  . ORIF MANDIBULAR FRACTURE N/A 03/17/2017   Procedure: MAXILLA MANDIBULAR FRACTURE FIXATION;  Surgeon: Suzanna Obey, MD;  Location: [facility] OR;  Service: ENT;  Laterality: N/A;        Home Medications    Prior to Admission medications   Medication Sig Start Date End Date Taking? Authorizing Provider  ALPRAZolam Prudy Feeler) 0.5 MG tablet Take 1 mg by mouth daily.   Yes [provider]  ARIPiprazole (ABILIFY) 5 MG tablet Take 5 mg by mouth daily. 08/11/17  Yes [provider]  hydrOXYzine (VISTARIL) 50 MG capsule Take 50 mg by mouth 2 (two) times daily as needed for anxiety. 08/11/17  Yes [provider]  sertraline (ZOLOFT) 50 MG tablet Take 100 mg by mouth daily. 08/11/17  Yes [provider]  traZODone (DESYREL) 50 MG tablet Take 50 mg by mouth at bedtime. 08/11/17  Yes [provider]  cephALEXin (KEFLEX) 500 MG capsule Take 1 capsule (500 mg total) by mouth 3 (three) times daily. Patient not taking: Reported on 08/28/2017 03/17/17   Suzanna Obey, MD  clindamycin (CLEOCIN) 300 MG capsule Take 1 capsule (300 mg total) by mouth 3 (three) times daily. May open cap and take with liquid Patient not taking: Reported on 08/28/2017 03/17/17   Osborn Coho, MD  HYDROcodone-acetaminophen (HYCET) 7.5-325 mg/15 ml  solution Take 10-15 mLs by mouth every 4 (four) hours as needed for moderate pain. Patient not taking: Reported on 08/28/2017 03/17/17   Osborn CohoShoemaker, David, MD  HYDROcodone-acetaminophen (LORTAB) 5-325 MG tablet Take 1 tablet by mouth every 6 (six) hours as needed for moderate pain. Patient not taking: Reported on 08/28/2017 03/17/17   Suzanna ObeyByers, John, MD    Family History No family history on file.  Social History Social History   Tobacco Use  . Smoking status: Current Some Day Smoker    Types: Cigarettes  . Smokeless tobacco: Never Used  Substance Use Topics  . Alcohol use: Yes  . Drug use: No     Allergies   Patient has no  known allergies.   Review of Systems Review of Systems  Constitutional: Negative for appetite change and fever.  Respiratory: Negative for shortness of breath.   Cardiovascular: Negative for chest pain.  Gastrointestinal: Negative for abdominal pain.  Genitourinary: Negative for dysuria.  Musculoskeletal: Negative for back pain.  Skin: Negative for rash.  Allergic/Immunologic: Negative for immunocompromised state.  Neurological: Negative for headaches.  Psychiatric/Behavioral: Positive for dysphoric mood, hallucinations and suicidal ideas. Negative for agitation, confusion and self-injury. The patient is not nervous/anxious.    Physical Exam Updated Vital Signs BP (!) 157/90   Pulse 96   Temp 98.9 F (37.2 C) (Oral)   Resp 16   SpO2 98%   Physical Exam  Constitutional: He appears well-developed.  HENT:  Head: Normocephalic.  Eyes: Conjunctivae are normal.  Bilateral eyes are injected.  Neck: Neck supple.  Cardiovascular: Normal rate, regular rhythm, normal heart sounds and intact distal pulses. Exam reveals no gallop and no friction rub.  No murmur heard. Pulmonary/Chest: Effort normal and breath sounds normal. No stridor. No respiratory distress. He has no wheezes. He has no rales. He exhibits no tenderness.  Abdominal: Soft. Bowel sounds are normal. He exhibits no distension and no mass. There is no tenderness. There is no rebound and no guarding. No hernia.  Neurological: He is alert.  Skin: Skin is warm and dry.  Psychiatric: His speech is normal. He is withdrawn and actively hallucinating. He exhibits a depressed mood. He expresses suicidal ideation. He expresses no homicidal ideation. He expresses suicidal plans. He expresses no homicidal plans. He exhibits normal recent memory.  Nursing note and vitals reviewed.  ED Treatments / Results  Labs (all labs ordered are listed, but only abnormal results are displayed) Labs Reviewed  ACETAMINOPHEN LEVEL - Abnormal;  Notable for the following components:      Result Value   Acetaminophen (Tylenol), Serum <10 (*)    All other components within normal limits  CBC - Abnormal; Notable for the following components:   WBC 11.0 (*)    All other components within normal limits  COMPREHENSIVE METABOLIC PANEL  ETHANOL  SALICYLATE LEVEL  RAPID URINE DRUG SCREEN, HOSP PERFORMED    EKG None  Radiology No results found.  Procedures Procedures (including critical care time)  Medications Ordered in ED Medications  nicotine (NICODERM CQ - dosed in mg/24 hours) patch 14 mg (has no administration in time range)     Initial Impression / Assessment and Plan / ED Course  I have reviewed the triage vital signs and the nursing notes.  Pertinent labs & imaging results that were available during my care of the patient were reviewed by me and considered in my medical decision making (see chart for details).      22 year old male with history of  seizures, ADHD, anxiety, absence induced psychotic disorder, and depression presenting to the ED by [employer] with SI and auditory hallucinations. Pt medically cleared at this time. Psych hold orders and home med orders placed. TTS consult pending; please see psych team notes for further documentation of care/dispo. Pt stable at time of med clearance.    Final Clinical Impressions(s) / ED Diagnoses   Final diagnoses:  None    ED Discharge Orders    None       Octavius Shin A, PA-C 08/28/17 2309    Mancel BaleWentz, Elliott, MD 08/29/17 [phone]

## 2017-08-28 NOTE — ED Notes (Signed)
Bed: [facility]4 Expected date:  Expected time:  Means of arrival:  Comments: 

## 2017-08-29 LAB — RAPID URINE DRUG SCREEN, HOSP PERFORMED
Amphetamines: NEGATIVE — AB
BARBITURATES: NEGATIVE — AB
Benzodiazepines: NEGATIVE — AB
Cocaine: NEGATIVE — AB
TETRAHYDROCANNABINOL: POSITIVE — AB
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
## 2017-08-29 NOTE — ED Notes (Signed)
Pt discharged safely with resources.  Pt was calm and cooperative.  Pt denied S/I and H/I, All belongings were returned to pt.  Bus passes were given.

## 2017-08-29 NOTE — BH Assessment (Signed)
[facility]  Per Juanetta BeetsJacqueline Norman, DO, this pt does not require psychiatric hospitalization at this time.  [facility] to [facility] with [facility] [place] residence.  This has been included in pt's discharge instructions.  Pt's nurse, Kendal Hymendie, has been notified.  Doylene Canninghomas Taneasha Fuqua, MA Triage Specialist [phone]

## 2017-08-29 NOTE — Discharge Instructions (Signed)
For your behavioral health needs, you are advised to follow up with [facility].  Contact them at your earliest opportunity to ask about enrolling in their program: ° °     [facility] °     [address] °     [place], [place] [address] °     [phone] ° °
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
## 2022-11-05 ENCOUNTER — Ambulatory Visit (HOSPITAL_COMMUNITY)
Admission: EM | Admit: 2022-11-05 | Discharge: 2022-11-06 | Disposition: A | Discharge status: Home / Self Care | Payer: Self-pay | Attending: Psychiatry | Admitting: Psychiatry

## 2022-11-05 DIAGNOSIS — F22 Delusional disorders: Secondary | ICD-10-CM | POA: Insufficient documentation

## 2022-11-05 DIAGNOSIS — F1210 Cannabis abuse, uncomplicated: Secondary | ICD-10-CM | POA: Insufficient documentation

## 2022-11-05 DIAGNOSIS — Z635 Disruption of family by separation and divorce: Secondary | ICD-10-CM | POA: Insufficient documentation

## 2022-11-05 DIAGNOSIS — I517 Cardiomegaly: Secondary | ICD-10-CM | POA: Insufficient documentation

## 2022-11-05 DIAGNOSIS — F17210 Nicotine dependence, cigarettes, uncomplicated: Secondary | ICD-10-CM | POA: Insufficient documentation

## 2022-11-05 DIAGNOSIS — F1994 Other psychoactive substance use, unspecified with psychoactive substance-induced mood disorder: Secondary | ICD-10-CM | POA: Insufficient documentation

## 2022-11-05 DIAGNOSIS — R000 Tachycardia, unspecified: Secondary | ICD-10-CM | POA: Insufficient documentation

## 2022-11-05 DIAGNOSIS — Z9151 Personal history of suicidal behavior: Secondary | ICD-10-CM | POA: Insufficient documentation

## 2022-11-05 DIAGNOSIS — F32A Depression, unspecified: Secondary | ICD-10-CM | POA: Insufficient documentation

## 2022-11-05 DIAGNOSIS — F1510 Other stimulant abuse, uncomplicated: Secondary | ICD-10-CM | POA: Insufficient documentation

## 2022-11-05 DIAGNOSIS — F419 Anxiety disorder, unspecified: Secondary | ICD-10-CM | POA: Insufficient documentation

## 2022-11-05 DIAGNOSIS — Z9152 Personal history of nonsuicidal self-harm: Secondary | ICD-10-CM | POA: Insufficient documentation

## 2022-11-05 NOTE — ED Notes (Signed)
[employer] Officer [place], contact number, [phone].
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
## 2022-11-05 NOTE — BH Assessment (Addendum)
Comprehensive Clinical Assessment (CCA) Note  11/06/2022 Brett Underwood [id number]  Chief Complaint:  Chief Complaint  Patient presents with   Depression   Visit Diagnosis: Substance Induced Mood Disorder.   DISPOSITION: MSE complete by Brett Asper, NP who is recommending overnight observation and re-assessment.  The patient demonstrates the following risk factors for suicide: Chronic risk factors for suicide include: psychiatric disorder of Bipolar, ADHD, MDD and Paranoia, previous suicide attempts via cutting, and previous self-harm via cutting forearm . Acute risk factors for suicide include: family or marital conflict, unemployment, loss (financial, interpersonal, professional), and recent discharge from inpatient psychiatry. Protective factors for this patient include: responsibility to others (children, family) and hope for the future. Considering these factors, the overall suicide risk at this point appears to be High.  Patient is not appropriate for outpatient follow up.    Brett Underwood is a 27 year old male patient brought in by Patent examiner. Pt reports that he was walking on the side of the road when law enforcement picked him up and brought him in. Pt reports that he was in route to [place] to visit a friend. Pt is currently homeless, was sleeping in his vehicle until his sister took the vehicle away. Pt reports that his sister purchased the car, and the car is in her name. Pt reports having friends who he can live/stay with but has chosen not to. Pt reports leaving Sober living today. Pt reports last inpatient BH hospitalization was 3 weeks ago for increased depression and SIB - cuts made to forearm with a knife. Pt denies SI/HI/AVH. Pt denies Substance/alcohol use. Pt reports having a history of Bipolar disorder, ADHD, Depression and Paranoia.  Pt identifies his primary stressors as his housing situation and his separation from his wife. Pt made comments about contacting his wife's  phone and a man answering the phone, but pt declined to elaborate. Pt denied having supports in place. Pt reports that he receives medication from [facility]. Pt reports that he was supposed to have an appointment earlier this week, however, did not attend. Pt reports that he has not taken his medication for the past 3 days.  Pt denies abuse or trauma and current legal problems. Pt reports having numerous inpatient psychiatric hospitalizations and reports that he was at [facility] in [place] after a suicide attempt via cutting his forearm with a knife a month ago. Pt reports that he has been cutting since he was 27 years old.   Pt is dressed in casual clothing, alert and oriented x3 with soft, garbled speech. Pt 's motor behavior is restless. Pt continued to stand up and pace throughout the assessment. Eye contact is fleeting. Pt's mood and affect are anxious. Thought content is appropriate to mood and circumstances. Pt's insight has gaps, and his judgement is impaired.  There is no indication that the pt is responding to internal stimuli or experiencing delusional thought content. Pt was cooperative yet guarded throughout the assessment. Pt continued to report that he knows what to say and what not to say to the questions that were asked in the assessment.   CCA Screening, Triage and Referral (STR)  Patient Reported Information How did you hear about [place]? Legal System  What Is the Reason for Your Visit/Call Today? Brett Underwood is a 27 year old male patient brought in by Patent examiner. Pt reports that he was walking on the side of the road when law enforcement picked him up and brought him in. Pt reports that he was in route  to [place] to visit a friend. Pt is currently homeless, was sleeping in his vehicle until his sister took the vehicle away. Pt reports that his sister purchased the car, and the car is in her name. Pt reports having friends who he can live/stay with but has chosen not to. Pt  denies SI/HI/AVH. Pt denies Substance/alcohol use. Pt reports having a history of Bipolar disorder, ADHD, Depression and Paranoia.  How Long Has This Been Causing You Problems? 1-6 months  What Do You Feel Would Help You the Most Today? Treatment for Depression or other mood problem; Medication(s); Housing Assistance; Stress Management; Social Support   Have You Recently Had Any Thoughts About Hurting Yourself? No  Are You Planning to Commit Suicide/Harm Yourself At This time? No   Flowsheet Row ED from 11/05/2022 in [facility] ED from 08/28/2017 in [facility] Emergency Department [facility]  C-SSRS RISK CATEGORY High Risk High Risk       Have you Recently Had Thoughts About Hurting Someone Brett Underwood? No  Are You Planning to Harm Someone at This Time? No  Explanation: Pt denied.   Have You Used Any Alcohol or Drugs in the Past 24 Hours? No  What Did You Use and How Much? Pt denied   Do You Currently Have a Therapist/Psychiatrist? Yes  Name of Therapist/Psychiatrist: Name of Therapist/Psychiatrist: [facility]   Have You Been Recently Discharged From Any Office Practice or Programs? Yes  Explanation of Discharge From Practice/Program: Per Medical Record, D/c from [facility] on 10/20/22.     CCA Screening Triage Referral Assessment Type of Contact: Face-to-Face  Telemedicine Service Delivery:   Is this Initial or Reassessment?   Date Telepsych consult ordered in [facility]:    Time Telepsych consult ordered in [facility]:    Location of Assessment: [facility] Assessment Services  Provider Location: [facility] Assessment Services   Collateral Involvement: None   Does Patient Have a Automotive engineer Guardian? No  Legal Guardian Contact Information: None  Copy of Legal Guardianship Form: -- (N/a)  Legal Guardian Notified of Arrival: -- (N/a)  Legal Guardian Notified of Pending Discharge: -- (N/a)  If Minor and Not Living with Parent(s), Who has  Custody? N/a  Is CPS involved or ever been involved? Never  Is APS involved or ever been involved? Never   Patient Determined To Be At Risk for Harm To Self or Others Based on Review of Patient Reported Information or Presenting Complaint? No  Method: No Plan  Availability of Means: No access or NA  Intent: Vague intent or NA  Notification Required: No need or identified person  Additional Information for Danger to Others Potential: -- (Pt denied)  Additional Comments for Danger to Others Potential: N/a  Are There Guns or Other Weapons in Your Home? No  Types of Guns/Weapons: Pt denied  Are These Weapons Safely Secured?                            -- (Pt denied)  Who Could Verify You Are Able To Have These Secured: Pt denied access to weapons  Do You Have any Outstanding Charges, Pending Court Dates, Parole/Probation? Pt denied  Contacted To Inform of Risk of Harm To Self or Others: -- (N/a)    Does Patient Present under Involuntary Commitment? No    [place] of Residence: Other (Comment) Nancy Marus)   Patient Currently Receiving the Following Services: Medication Management   Determination  of Need: Urgent (48 hours)   Options For Referral: Inpatient Hospitalization; Therapeutic Triage Services; [facility]gent Care; Medication Management; Intensive Outpatient Therapy     CCA Biopsychosocial Patient Reported Schizophrenia/Schizoaffective Diagnosis in Past: No   Strengths: Patient was cooperative throughout the assessment.   Mental Health Symptoms Depression:   None   Duration of Depressive symptoms:    Mania:   None   Anxiety:    None   Psychosis:   None   Duration of Psychotic symptoms:    Trauma:   None   Obsessions:   None   Compulsions:   None   Inattention:   None   Hyperactivity/Impulsivity:   None   Oppositional/Defiant Behaviors:   None   Emotional Irregularity:   None   Other Mood/Personality Symptoms:  none    Mental  Status Exam Appearance and self-care  Stature:   Average   Weight:   Average weight   Clothing:   Casual   Grooming:   Neglected   Cosmetic use:   None   Posture/gait:   Normal   Motor activity:   Restless   Sensorium  Attention:   Distractible   Concentration:   Focuses on irrelevancies   Orientation:   Person; Place; Situation   Recall/memory:   Normal   Affect and Mood  Affect:   Anxious   Mood:   Anxious   Relating  Eye contact:   Fleeting   Facial expression:   Responsive   Attitude toward examiner:   Cooperative; Guarded   Thought and Language  Speech flow:  Soft   Thought content:   Appropriate to Mood and Circumstances   Preoccupation:   None   Hallucinations:   None   Organization:   Insurance underwriter of Knowledge:   Fair   Intelligence:   Average   Abstraction:   Normal   Judgement:   Impaired   Reality Testing:   Adequate   Insight:   Gaps   Decision Making:   Impulsive   Social Functioning  Social Maturity:   Irresponsible   Social Judgement:   Heedless   Stress  Stressors:   Housing; Relationship   Coping Ability:   Contractor Deficits:   None   Supports:   Support needed     Religion: Religion/Spirituality Are You A Religious Person?: No How Might This Affect Treatment?: N/a  Leisure/Recreation: Leisure / Recreation Do You Have Hobbies?: Yes Leisure and Hobbies: Karate, Cytogeneticist and Music  Exercise/Diet: Exercise/Diet Do You Exercise?: No Have You Gained or Lost A Significant Amount of Weight in the Past Six Months?: No Do You Follow a Special Diet?: No Do You Have Any Trouble Sleeping?: No   CCA Employment/Education Employment/Work Situation: Employment / Work Situation Employment Situation: Unemployed Patient's Job has Been Impacted by Current Illness: No Has Patient ever Been in Equities trader?: No  Education: Education Is Patient  Currently Attending School?: No Last Grade Completed: 9 Did You Product manager?: No Did You Have An Individualized Education Program (IIEP): No Did You Have Any Difficulty At [employer]?: No Patient's Education Has Been Impacted by Current Illness: No   CCA Family/Childhood History Family and Relationship History: Family history Marital status: Separated Separated, when?: Unknown What types of issues is patient dealing with in the relationship?: Unknown Additional relationship information: None Does patient have children?: Yes How many children?: 1 How is patient's relationship with their children?: Pt reports having a child  who will be born next month.  Childhood History:  Childhood History By whom was/is the patient raised?: Adoptive parents Did patient suffer any verbal/emotional/physical/sexual abuse as a child?: No Did patient suffer from severe childhood neglect?: No Has patient ever been sexually abused/assaulted/raped as an adolescent or adult?: No Was the patient ever a victim of a crime or a disaster?: No Witnessed domestic violence?: Yes Has patient been affected by domestic violence as an adult?: No Description of domestic violence: Pt reports that he did not witness DV in his household, but in other homes.       CCA Substance Use Alcohol/Drug Use: Alcohol / Drug Use Pain Medications: SEE MAR Prescriptions: SEE MAR Over the Counter: SEE MAR History of alcohol / drug use?: No history of alcohol / drug abuse Longest period of sobriety (when/how long): N/A Negative Consequences of Use:  (N/A) Withdrawal Symptoms: None                         ASAM's:  Six Dimensions of Multidimensional Assessment  Dimension 1:  Acute Intoxication and/or Withdrawal Potential:      Dimension 2:  Biomedical Conditions and Complications:      Dimension 3:  Emotional, Behavioral, or Cognitive Conditions and Complications:     Dimension 4:  Readiness to Change:      Dimension 5:  Relapse, Continued use, or Continued Problem Potential:     Dimension 6:  Recovery/Living Environment:     ASAM Severity Score:    ASAM Recommended Level of Treatment:     Substance use Disorder (SUD)    Recommendations for Services/Supports/Treatments:    Discharge Disposition: Discharge Disposition Medical Exam completed: Yes  DSM5 Diagnoses: Patient Active Problem List   Diagnosis Date Noted   Fracture of angle of right mandible, initial encounter for closed fracture (HCC) 03/17/2017     Referrals to Alternative Service(s): Referred to Alternative Service(s):   Place:   Date:   Time:    Referred to Alternative Service(s):   Place:   Date:   Time:    Referred to Alternative Service(s):   Place:   Date:   Time:    Referred to Alternative Service(s):   Place:   Date:   Time:     Mallie Darting, MSW, LCSW

## 2022-11-05 NOTE — Progress Notes (Signed)
   11/05/22 2323  [facility] Triage Screening (Walk-ins at [facility] only)  How Did You Hear About [place]? Legal System  What Is the Reason for Your Visit/Call Today? Brett Underwood is a 27 year old male patient brought in by Patent examiner. Pt reports that he was walking on the side of the road when law enforcement picked him up and brought him in. Pt reports that he was in route to [place] to visit a friend. Pt is currently homeless, was sleeping in his vehicle until his sister took the vehicle away. Pt reports that his sister purchased the car, and the car is in her name. Pt reports having friends who he can live/stay with but has chosen not to. Pt reports leaving Sober living today. Pt reports last inpatient [facility] hospitalization was 3 wks ago for increased depression and SIB - cuts made to forearm with a knife. Pt denies SI/HI/AVH. Pt denies Substance/alcohol use. Pt reports having a history of Bipolar disorder, ADHD, Depression and Paranoia.  How Long Has This Been Causing You Problems? 1-6 months  Have You Recently Had Any Thoughts About Hurting Yourself? No  Are You Planning to Commit Suicide/Harm Yourself At This time? No  Have you Recently Had Thoughts About Hurting Someone [name]? No  Are You Planning To Harm Someone At This Time? No  Are you currently experiencing any auditory, visual or other hallucinations? No  Have You Used Any Alcohol or Drugs in the Past 24 Hours? No  Do you have any current medical co-morbidities that require immediate attention? No  Clinician description of patient physical appearance/behavior: Pt is casually dressed, restless behavior of standing and pacing during the assessment.  What Do You Feel Would Help You the Most Today? Treatment for Depression or other mood problem;Medication(s);Housing Assistance;Stress Management;Social Support  If access to [facility] Urgent Care was not available, would you have sought care in the Emergency Department? Yes  Determination of Need Urgent (48  hours)  Options For Referral Inpatient Hospitalization;Medication Management;Intensive Outpatient Therapy;BH Urgent Care;Therapeutic Triage Services

## 2022-11-06 ENCOUNTER — Encounter (HOSPITAL_COMMUNITY): Payer: Self-pay | Admitting: Emergency Medicine

## 2022-11-06 ENCOUNTER — Emergency Department (HOSPITAL_COMMUNITY)
Admission: EM | Admit: 2022-11-06 | Discharge: 2022-11-07 | Disposition: A | Discharge status: Home / Self Care | Payer: No Typology Code available for payment source | Attending: Emergency Medicine | Admitting: Emergency Medicine

## 2022-11-06 ENCOUNTER — Other Ambulatory Visit: Payer: Self-pay

## 2022-11-06 ENCOUNTER — Emergency Department (HOSPITAL_COMMUNITY): Payer: No Typology Code available for payment source

## 2022-11-06 DIAGNOSIS — S50851A Superficial foreign body of right forearm, initial encounter: Secondary | ICD-10-CM | POA: Insufficient documentation

## 2022-11-06 DIAGNOSIS — W458XXA Other foreign body or object entering through skin, initial encounter: Secondary | ICD-10-CM | POA: Insufficient documentation

## 2022-11-06 DIAGNOSIS — F1994 Other psychoactive substance use, unspecified with psychoactive substance-induced mood disorder: Secondary | ICD-10-CM | POA: Diagnosis present

## 2022-11-06 DIAGNOSIS — M795 Residual foreign body in soft tissue: Secondary | ICD-10-CM

## 2022-11-06 LAB — CBC WITH DIFFERENTIAL/PLATELET
Abs Immature Granulocytes: 0.06 10*3/uL (ref 0.00–0.07)
Basophils Absolute: 0.1 10*3/uL (ref 0.0–0.1)
Basophils Relative: 1 %
Eosinophils Absolute: 0.1 10*3/uL (ref 0.0–0.5)
Eosinophils Relative: 0 %
HCT: 45.3 % (ref 39.0–52.0)
Hemoglobin: 15.5 g/dL (ref 13.0–17.0)
Immature Granulocytes: 0 %
Lymphocytes Relative: 15 %
Lymphs Abs: 2.3 10*3/uL (ref 0.7–4.0)
MCH: 28.9 pg (ref 26.0–34.0)
MCHC: 34.2 g/dL (ref 30.0–36.0)
MCV: 84.4 fL (ref 80.0–100.0)
Monocytes Absolute: 0.8 10*3/uL (ref 0.1–1.0)
Monocytes Relative: 5 %
Neutro Abs: 11.9 10*3/uL — ABNORMAL HIGH (ref 1.7–7.7)
Neutrophils Relative %: 79 %
Platelets: 290 10*3/uL (ref 150–400)
RBC: 5.37 MIL/uL (ref 4.22–5.81)
RDW: 16.5 % — ABNORMAL HIGH (ref 11.5–15.5)
WBC: 15.1 10*3/uL — ABNORMAL HIGH (ref 4.0–10.5)
nRBC: 0 % (ref 0.0–0.2)

## 2022-11-06 LAB — COMPREHENSIVE METABOLIC PANEL
ALT: 19 U/L (ref 0–44)
AST: 37 U/L (ref 15–41)
Albumin: 4.7 g/dL (ref 3.5–5.0)
Alkaline Phosphatase: 41 U/L (ref 38–126)
Anion gap: 17 — ABNORMAL HIGH (ref 5–15)
BUN: 23 mg/dL — ABNORMAL HIGH (ref 6–20)
CO2: 20 mmol/L — ABNORMAL LOW (ref 22–32)
Calcium: 9.5 mg/dL (ref 8.9–10.3)
Chloride: 99 mmol/L (ref 98–111)
Creatinine, Ser: 1.37 mg/dL — ABNORMAL HIGH (ref 0.61–1.24)
GFR, Estimated: >60 mL/min (ref >60)
Glucose, Bld: 97 mg/dL (ref 70–99)
Potassium: 3.6 mmol/L (ref 3.5–5.1)
Sodium: 136 mmol/L (ref 135–145)
Total Bilirubin: 1.6 mg/dL — ABNORMAL HIGH (ref 0.3–1.2)
Total Protein: 8.2 g/dL — ABNORMAL HIGH (ref 6.5–8.1)

## 2022-11-06 LAB — HEMOGLOBIN A1C
Hgb A1c MFr Bld: 5.6 % (ref 4.8–5.6)
Mean Plasma Glucose: 114.02 mg/dL

## 2022-11-06 LAB — LIPID PANEL
Cholesterol: 169 mg/dL (ref 0–200)
HDL: 55 mg/dL (ref >40)
LDL Cholesterol: 107 mg/dL — ABNORMAL HIGH (ref 0–99)
Total CHOL/HDL Ratio: 3.1 {ratio}
Triglycerides: 35 mg/dL (ref <150)
VLDL: 7 mg/dL (ref 0–40)

## 2022-11-06 LAB — TSH: TSH: 1.577 u[IU]/mL (ref 0.350–4.500)

## 2022-11-06 LAB — ETHANOL: Alcohol, Ethyl (B): <10 mg/dL (ref <10)

## 2022-11-06 MED ORDER — ACETAMINOPHEN 325 MG PO TABS
650.0000 mg | ORAL_TABLET | Freq: Four times a day (QID) | ORAL | Status: DC | PRN
  Administered 2022-11-06: 650 mg via ORAL
  Filled 2022-11-06: qty 2

## 2022-11-06 MED ORDER — ALUM & MAG HYDROXIDE-SIMETH 200-200-20 MG/5ML PO SUSP: 30.0000 mL | ORAL | Status: DC | PRN

## 2022-11-06 MED ORDER — MAGNESIUM HYDROXIDE 400 MG/5ML PO SUSP: 30.0000 mL | Freq: Every day | ORAL | Status: DC | PRN

## 2022-11-06 MED ORDER — OLANZAPINE 10 MG PO TBDP
10.0000 mg | ORAL_TABLET | Freq: Three times a day (TID) | ORAL | Status: DC | PRN
  Administered 2022-11-06: 10 mg via ORAL
  Filled 2022-11-06: qty 1

## 2022-11-06 MED ORDER — LORAZEPAM 1 MG PO TABS: 1.0000 mg | ORAL_TABLET | Freq: Once | ORAL | Status: DC

## 2022-11-06 MED ORDER — TRAZODONE HCL 50 MG PO TABS
50.0000 mg | ORAL_TABLET | Freq: Every evening | ORAL | Status: DC | PRN
  Administered 2022-11-06: 50 mg via ORAL
  Filled 2022-11-06: qty 1

## 2022-11-06 MED ORDER — ZIPRASIDONE MESYLATE 20 MG IM SOLR: 20.0000 mg | Freq: Two times a day (BID) | INTRAMUSCULAR | Status: DC | PRN

## 2022-11-06 MED ORDER — LORAZEPAM 1 MG PO TABS
1.0000 mg | ORAL_TABLET | ORAL | Status: AC | PRN
  Administered 2022-11-06: 1 mg via ORAL
  Filled 2022-11-06: qty 1

## 2022-11-06 MED ORDER — HYDROXYZINE HCL 25 MG PO TABS: 25.0000 mg | ORAL_TABLET | Freq: Three times a day (TID) | ORAL | Status: DC | PRN

## 2022-11-06 NOTE — ED Notes (Signed)
Pt sleeping at present, no distress noted.  Monitoring for safety. 

## 2022-11-06 NOTE — Discharge Instructions (Signed)
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
## 2022-11-06 NOTE — ED Provider Notes (Signed)
This morning, this provider tried 2 times to speak with this patient. He was given a choice to "speak with me now or at a later time" patient replied "no". And returned to sleep.

## 2022-11-06 NOTE — ED Notes (Signed)
Pt A&O x 4, presents with manic behavior, anxiety, pacing, restlessness.  Pressured speech.  Pt eval by NP Ene Ajibola.  PRN meds given.  Pt took po meds without resistance to staff.  Pt sleeping at present, respirations even & unlabored.  Monitoring for safety.

## 2022-11-06 NOTE — ED Notes (Signed)
Patient resting quietly in bed. Became slightly startled when woken w/o adverse behaviors. Patient denies SI, HI, AVH. He does not appear to be responding to internal stimuli. Patient requested a protein bar and OJ, consumed and laid back down.
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
## 2022-11-06 NOTE — ED Notes (Signed)
Patient was awake, when approached pretended to be sleep, declining to communicate.
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
## 2022-11-06 NOTE — ED Provider Notes (Signed)
FBC/OBS ASAP Discharge Summary  Date and Time: 11/06/2022 4:24 PM  Name: Brett Underwood  MRN:  [id number]   Discharge Diagnoses:  Final diagnoses:  Substance induced mood disorder (HCC)    Subjective: On evaluation today, the patient is standing in the common area, near the bathroom door. He is anxious, but cooperative during this assessment. His appearance is appropriate for environment. His eye contact is good.  Speech is clear and coherent, normal pace and normal volume. He is alert and oriented x3 to person, place, and situation. He reports his mood is "ok", but states he is ready to go.  Affect is congruent with mood.  Thought process is coherent.  Thought content is within normal limits. He denies auditory and visual hallucinations.  No indication that he is responding to internal stimuli during this assessment. No delusions elicited during this assessment. He denies suicidal ideations.  He denies homicidal ideations.  Appetite and sleep are fair. He says that he will go to a friends house today, who will help him get to [place], [place], where he has family and he says he is looking for a job in [place], [place] and has family that can help him.  Memory, judgment, and insight fair.  Patient able to contract for safety. Patient states he is not interested in any substance abuse treatment or therapy at this time.   Spoke with patient father Gavynn Pole and he reports that he does not feel that patient is a danger to self, or anyone else. He says his son just really has a problem with substance abuse. He says he helped his son get into the [facility], and says you can not help, if they don't want help. He states he is very supportive of his son. Discussed with Mr. Bass safety planning to have the patient discharged today, as he does not present currently as an imminent risk to himself or others at this time. He was in agreement that patient could be discharged today, as he feels patient will be  safe.   Support, encouragement and reassurance provided about ongoing stressors and patient provided with opportunity for questions.   Total Time spent with patient: 15 minutes  Past Psychiatric History: ADHD, anxiety, depression Past Medical History: hx seizures Family History: None reported  Family Psychiatric History: None reported   Tobacco Cessation:  A prescription for an FDA-approved tobacco cessation medication was offered at discharge and the patient refused  Current Medications:  Current Facility-Administered Medications  Medication Dose Route Frequency Provider Last Rate Last Admin   acetaminophen (TYLENOL) tablet 650 mg  650 mg Oral Q6H PRN Ajibola, Ene A, NP   650 mg at 11/06/22 0050   alum & mag hydroxide-simeth (MAALOX/MYLANTA) 200-200-20 MG/5ML suspension 30 mL  30 mL Oral Q4H PRN Ajibola, Ene A, NP       hydrOXYzine (ATARAX) tablet 25 mg  25 mg Oral TID PRN Ajibola, Ene A, NP       magnesium hydroxide (MILK OF MAGNESIA) suspension 30 mL  30 mL Oral Daily PRN Ajibola, Ene A, NP       OLANZapine zydis (ZYPREXA) disintegrating tablet 10 mg  10 mg Oral Q8H PRN Ajibola, Ene A, NP   10 mg at 11/06/22 0050   traZODone (DESYREL) tablet 50 mg  50 mg Oral QHS PRN Ajibola, Ene A, NP   50 mg at 11/06/22 0050   Current Outpatient Medications  Medication Sig Dispense Refill   buPROPion (WELLBUTRIN XL) 150 MG 24  hr tablet Take 150 mg by mouth daily.     traZODone (DESYREL) 50 MG tablet Take 50 mg by mouth at bedtime. (Patient not taking: Reported on 11/06/2022)  0    PTA Medications:  PTA Medications  Medication Sig   buPROPion (WELLBUTRIN XL) 150 MG 24 hr tablet Take 150 mg by mouth daily.   traZODone (DESYREL) 50 MG tablet Take 50 mg by mouth at bedtime. (Patient not taking: Reported on 11/06/2022)   Facility Ordered Medications  Medication   acetaminophen (TYLENOL) tablet 650 mg   alum & mag hydroxide-simeth (MAALOX/MYLANTA) 200-200-20 MG/5ML suspension 30 mL   magnesium  hydroxide (MILK OF MAGNESIA) suspension 30 mL   traZODone (DESYREL) tablet 50 mg   hydrOXYzine (ATARAX) tablet 25 mg   OLANZapine zydis (ZYPREXA) disintegrating tablet 10 mg   And   [COMPLETED] LORazepam (ATIVAN) tablet 1 mg       11/06/2022    4:24 PM  Depression screen PHQ 2/9  Decreased Interest 0  Down, Depressed, Hopeless 0  PHQ - 2 Score 0  Difficult doing work/chores Not difficult at all    Flowsheet Row ED from 11/05/20[facility] in [facility] from 08/28/2017 in [facility]  C-SSRS RISK CATEGORY Error: Question 6 not populated High Risk       Musculoskeletal  Strength & Muscle Tone: within normal limits Gait & Station: normal Patient leans: N/A  Psychiatric Specialty Exam  Presentation  General Appearance:  Appropriate for Environment  Eye Contact: Fleeting  Speech: Clear and Coherent  Speech Volume: Normal  Handedness: Right   Mood and Affect  Mood: Anxious  Affect: Appropriate; Flat   Thought Process  Thought Processes: Coherent  Descriptions of Associations:Loose  Orientation:Partial  Thought Content:WDL  Diagnosis of Schizophrenia or Schizoaffective disorder in past: No    Hallucinations:Hallucinations: None  Ideas of Reference:None  Suicidal Thoughts:Suicidal Thoughts: No  Homicidal Thoughts:Homicidal Thoughts: No   Sensorium  Memory: Immediate Fair; Recent Fair  Judgment: Fair  Insight: Fair   Art therapist  Concentration: Good  Attention Span: FairPeri Jefferson  Recall: Good  Fund of Knowledge: Good  Language: Good   Psychomotor Activity  Psychomotor Activity: Psychomotor Activity: Normal   Assets  Assets: Communication Skills; Desire for Improvement; Social Support   Sleep  Sleep: Sleep: Fair Number of Hours of Sleep: 2   Nutritional Assessment (For OBS and FBC admissions only) Has the patient had a weight loss or  gain of 10 pounds or more in the last 3 months?: No Has the patient had a decrease in food intake/or appetite?: No Does the patient have dental problems?: No Does the patient have eating habits or behaviors that may be indicators of an eating disorder including binging or inducing vomiting?: No Has the patient recently lost weight without trying?: 0 Has the patient been eating poorly because of a decreased appetite?: 0 Malnutrition Screening Tool Score: 0    Physical Exam  Physical Exam Vitals and nursing note reviewed. Exam conducted with a chaperone present.  Psychiatric:        Attention and Perception: Attention normal.        Mood and Affect: Mood is anxious. Affect is flat.        Speech: Speech normal.        Behavior: Behavior is cooperative.        Thought Content: Thought content normal.        Cognition and Memory: Memory normal.  Judgment: Judgment is impulsive.    Review of Systems  Psychiatric/Behavioral:  Positive for substance abuse.    Blood pressure 103/71, pulse 88, temperature (!) 97.4 F (36.3 C), temperature source Oral, resp. rate 17, SpO2 100%. There is no height or weight on file to calculate BMI.  Demographic Factors:  Male, Low socioeconomic status, and Unemployed  Loss Factors: Financial problems/change in socioeconomic status  Historical Factors: Impulsivity  Risk Reduction Factors:   Sense of responsibility to family and Positive social support  Continued Clinical Symptoms:  Bipolar Disorder:   Bipolar II  Suicide Risk:  Minimal: No identifiable suicidal ideation.  Patients presenting with no risk factors but with morbid ruminations; may be classified as minimal risk based on the severity of the depressive symptoms   Disposition: Patient does not meet criteria for psychiatric inpatient admission. Supportive therapy provided about ongoing stressors. Discussed crisis plan, support from social network, calling 911, coming to the  Emergency Department, and calling Suicide Hotline.  Patient is psychiatrically cleared. Patient case review and discussed with Dr. Viviano Simas, and patient does not meet inpatient criteria for inpatient psychiatric treatment. At time of discharge, patient denies SI, HI, AVH and can contract for safety. He demonstrated no overt evidence of psychosis or mania.  Patient given resources to follow up with behavioral health urgent care for therapy and medication management. Patient also given information to follow up with [facility], in case he wants to use it. Patient denies access to weapons. Safety planning completed.   Alexanderjames Berg MOTLEY-MANGRUM, PMHNP 11/06/2022, 4:24 PM

## 2022-11-07 MED ORDER — ACETAMINOPHEN 500 MG PO TABS
1000.0000 mg | ORAL_TABLET | Freq: Once | ORAL | Status: AC
  Administered 2022-11-07: 1000 mg via ORAL
  Filled 2022-11-07: qty 2

## 2022-11-07 NOTE — ED Provider Notes (Signed)
[place] EMERGENCY DEPARTMENT AT [facility] Provider Note   CSN: [id number] Arrival date & time: 11/06/22  2116     History  Chief Complaint  Patient presents with   Foreign Body in Skin    Brett Underwood is a 27 y.o. male.  The history is provided by the patient.  Foreign Body Location:  Skin Suspected object:  Glass Pain quality:  Aching Pain severity:  Severe Timing:  Constant Progression:  Unchanged Chronicity:  New Worsened by:  Nothing Ineffective treatments:  None tried Associated symptoms: no abdominal pain   Risk factors: no prior surgery to area   Reports punching a window over a month ago     Home Medications Prior to Admission medications   Medication Sig Start Date End Date Taking? Authorizing Provider  buPROPion (WELLBUTRIN XL) 150 MG 24 hr tablet Take 150 mg by mouth daily. 10/21/22   [provider]  traZODone (DESYREL) 50 MG tablet Take 50 mg by mouth at bedtime. Patient not taking: Reported on 11/06/2022 08/11/17   [provider]      Allergies    Patient has no known allergies.    Review of Systems   Review of Systems  Constitutional:  Negative for fever.  HENT:  Negative for facial swelling.   Respiratory:  Negative for wheezing and stridor.   Gastrointestinal:  Negative for abdominal pain.  All other systems reviewed and are negative.   Physical Exam Updated Vital Signs BP (!) 147/107 (BP Location: Right Arm)   Pulse (!) 59   Temp 97.7 F (36.5 C) (Oral)   Resp 16   SpO2 100%  Physical Exam Vitals and nursing note reviewed.  Constitutional:      General: He is not in acute distress.    Appearance: Normal appearance. He is well-developed. He is not diaphoretic.  HENT:     Head: Normocephalic and atraumatic.     Nose: Nose normal.  Eyes:     Conjunctiva/sclera: Conjunctivae normal.     Pupils: Pupils are equal, round, and reactive to light.  Cardiovascular:     Rate and Rhythm: Normal rate and  regular rhythm.  Pulmonary:     Effort: Pulmonary effort is normal.     Breath sounds: Normal breath sounds. No wheezing or rales.  Abdominal:     General: Bowel sounds are normal.     Palpations: Abdomen is soft.     Tenderness: There is no abdominal tenderness. There is no guarding or rebound.  Musculoskeletal:        General: Normal range of motion.     Cervical back: Normal range of motion and neck supple.  Skin:    General: Skin is warm and dry.     Capillary Refill: Capillary refill takes less than 2 seconds.  Neurological:     General: No focal deficit present.     Mental Status: He is alert.     Deep Tendon Reflexes: Reflexes normal.  Psychiatric:        Mood and Affect: Mood normal.     ED Results / Procedures / Treatments   Labs (all labs ordered are listed, but only abnormal results are displayed) Labs Reviewed - No data to display  EKG None  Radiology DG Forearm Right  Result Date: 11/06/2022 CLINICAL DATA:  Foreign body in the skin. Punched through a window 4 weeks ago. EXAM: RIGHT FOREARM - 2 VIEW COMPARISON:  None Available. FINDINGS: A radiopaque foreign body is demonstrated in  the soft tissues over the volar medial aspect of the mid right forearm. The foreign body measures about 7 mm in diameter and in the AP view localizes about 2.5 mm below the skin surface. No soft tissue gas is identified. Bones appear intact. No acute fracture or dislocation. Joint spaces are normal. IMPRESSION: Radiopaque soft tissue foreign body demonstrated in the mid right forearm. Electronically Signed   By: Burman Nieves M.D.   On: 11/06/2022 22:35    Procedures Procedures    Medications Ordered in ED Medications  acetaminophen (TYLENOL) tablet 1,000 mg (has no administration in time range)    ED Course/ Medical Decision Making/ A&P                                 Medical Decision Making Patient with reported glass in the forearm   Amount and/or Complexity of Data  Reviewed External Data Reviewed: notes.    Details: Previous notes reviewed   Risk OTC drugs. Risk Details: Glass is deep on Xray.  Patient informed he can follow up with surgery for outpatient removal.  Stable for discharge.      Final Clinical Impression(s) / ED Diagnoses Final diagnoses:  Foreign body (FB) in soft tissue   Return for intractable cough, coughing up blood, fevers > 100.4 unrelieved by medication, shortness of breath, intractable vomiting, chest pain, shortness of breath, weakness, numbness, changes in speech, facial asymmetry, abdominal pain, passing out, Inability to tolerate liquids or food, cough, altered mental status or any concerns. No signs of systemic illness or infection. The patient is nontoxic-appearing on exam and vital signs are within normal limits.  I have reviewed the triage vital signs and the nursing notes. Pertinent labs & imaging results that were available during my care of the patient were reviewed by me and considered in my medical decision making (see chart for details). After history, exam, and medical workup I feel the patient has been appropriately medically screened and is safe for discharge home. Pertinent diagnoses were discussed with the patient. Patient was given return precautions.  Rx / DC Orders ED Discharge Orders     None         Hula Tasso, MD 11/07/22 [phone]
